# Patient Record
Sex: Male | Born: 2000 | Race: Black or African American | Hispanic: No | Marital: Single
Health system: Southern US, Community
[De-identification: ages and names within clinical notes are randomized; demographics above are authoritative.]

## PROBLEM LIST (undated history)

## (undated) DIAGNOSIS — F4310 Post-traumatic stress disorder, unspecified: Secondary | ICD-10-CM

## (undated) DIAGNOSIS — F909 Attention-deficit hyperactivity disorder, unspecified type: Secondary | ICD-10-CM

## (undated) DIAGNOSIS — R4689 Other symptoms and signs involving appearance and behavior: Secondary | ICD-10-CM

## (undated) DIAGNOSIS — F913 Oppositional defiant disorder: Secondary | ICD-10-CM

---
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
## 2021-03-04 IMAGING — DX DG FOOT COMPLETE 3+V*R*
3 series · 3 of 3 positions shown · non-contrast
Comparison: None.

CLINICAL DATA: 18-year-old male with spider bite to the right foot

EXAM:
RIGHT FOOT COMPLETE - 3+ VIEW

[foot ap]
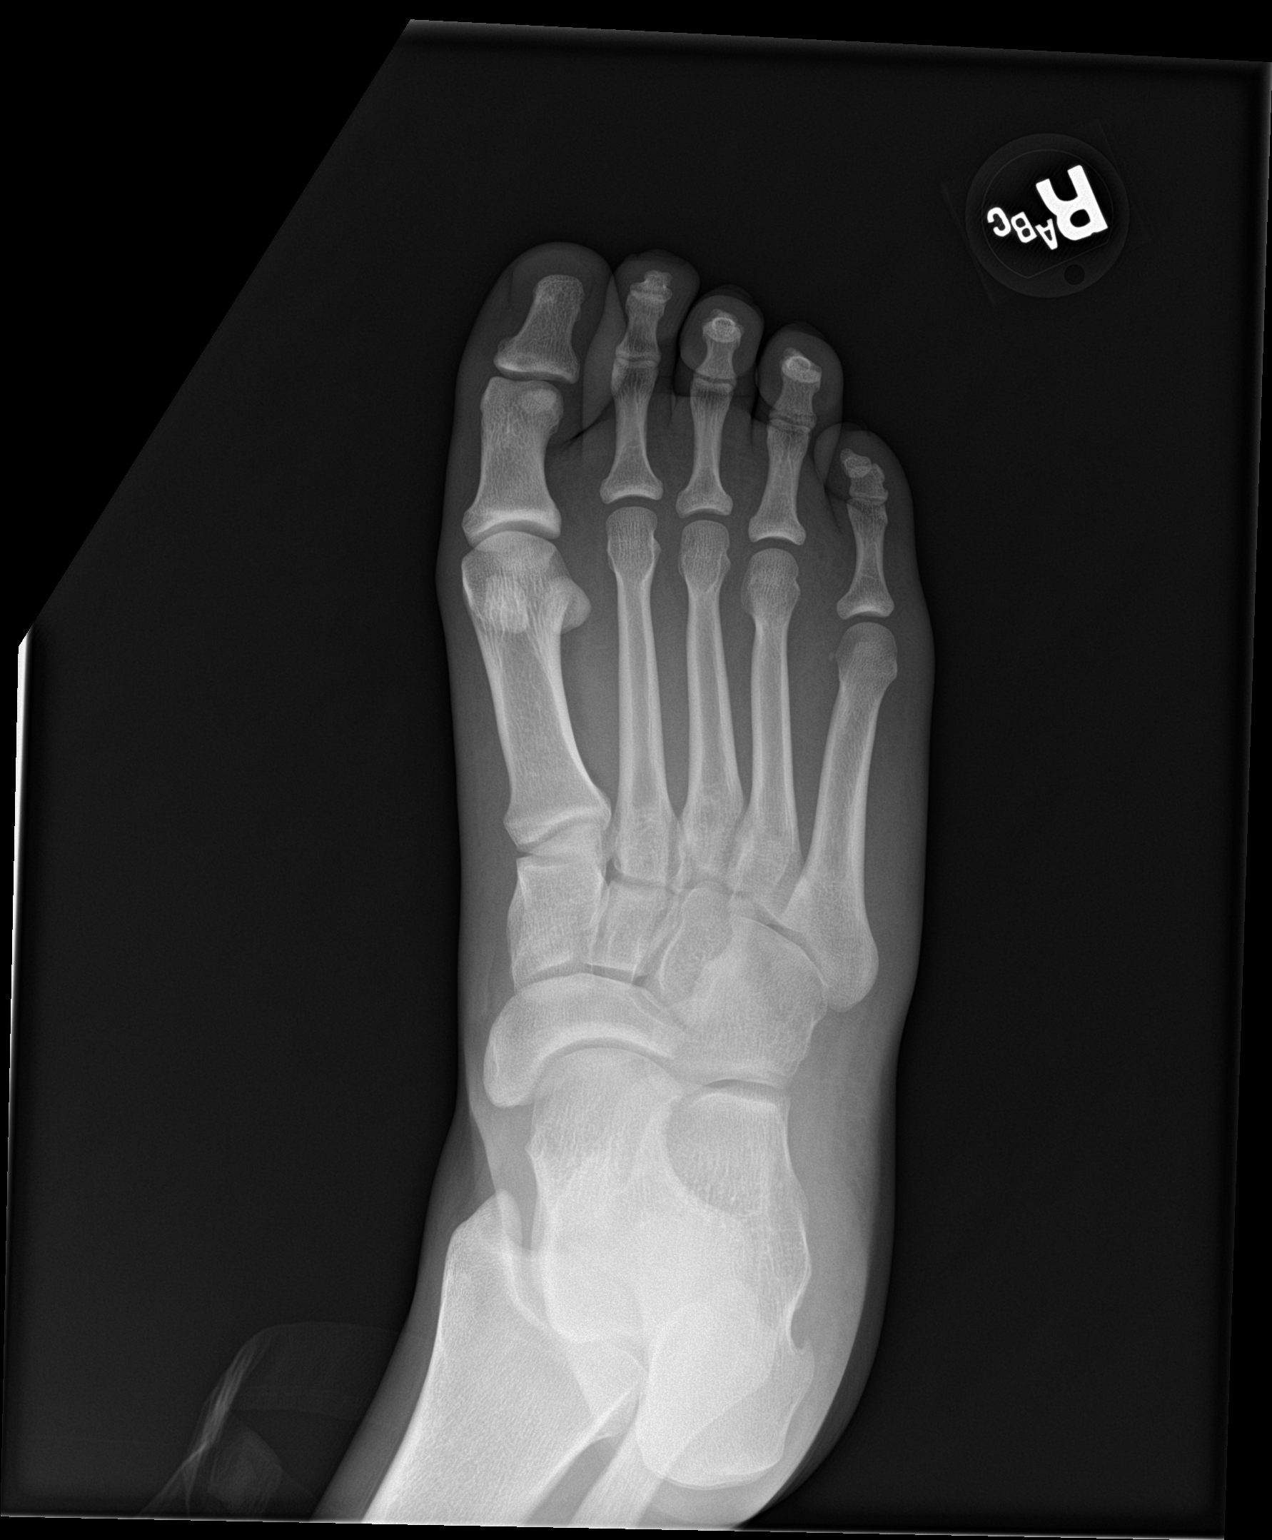

[foot obl]
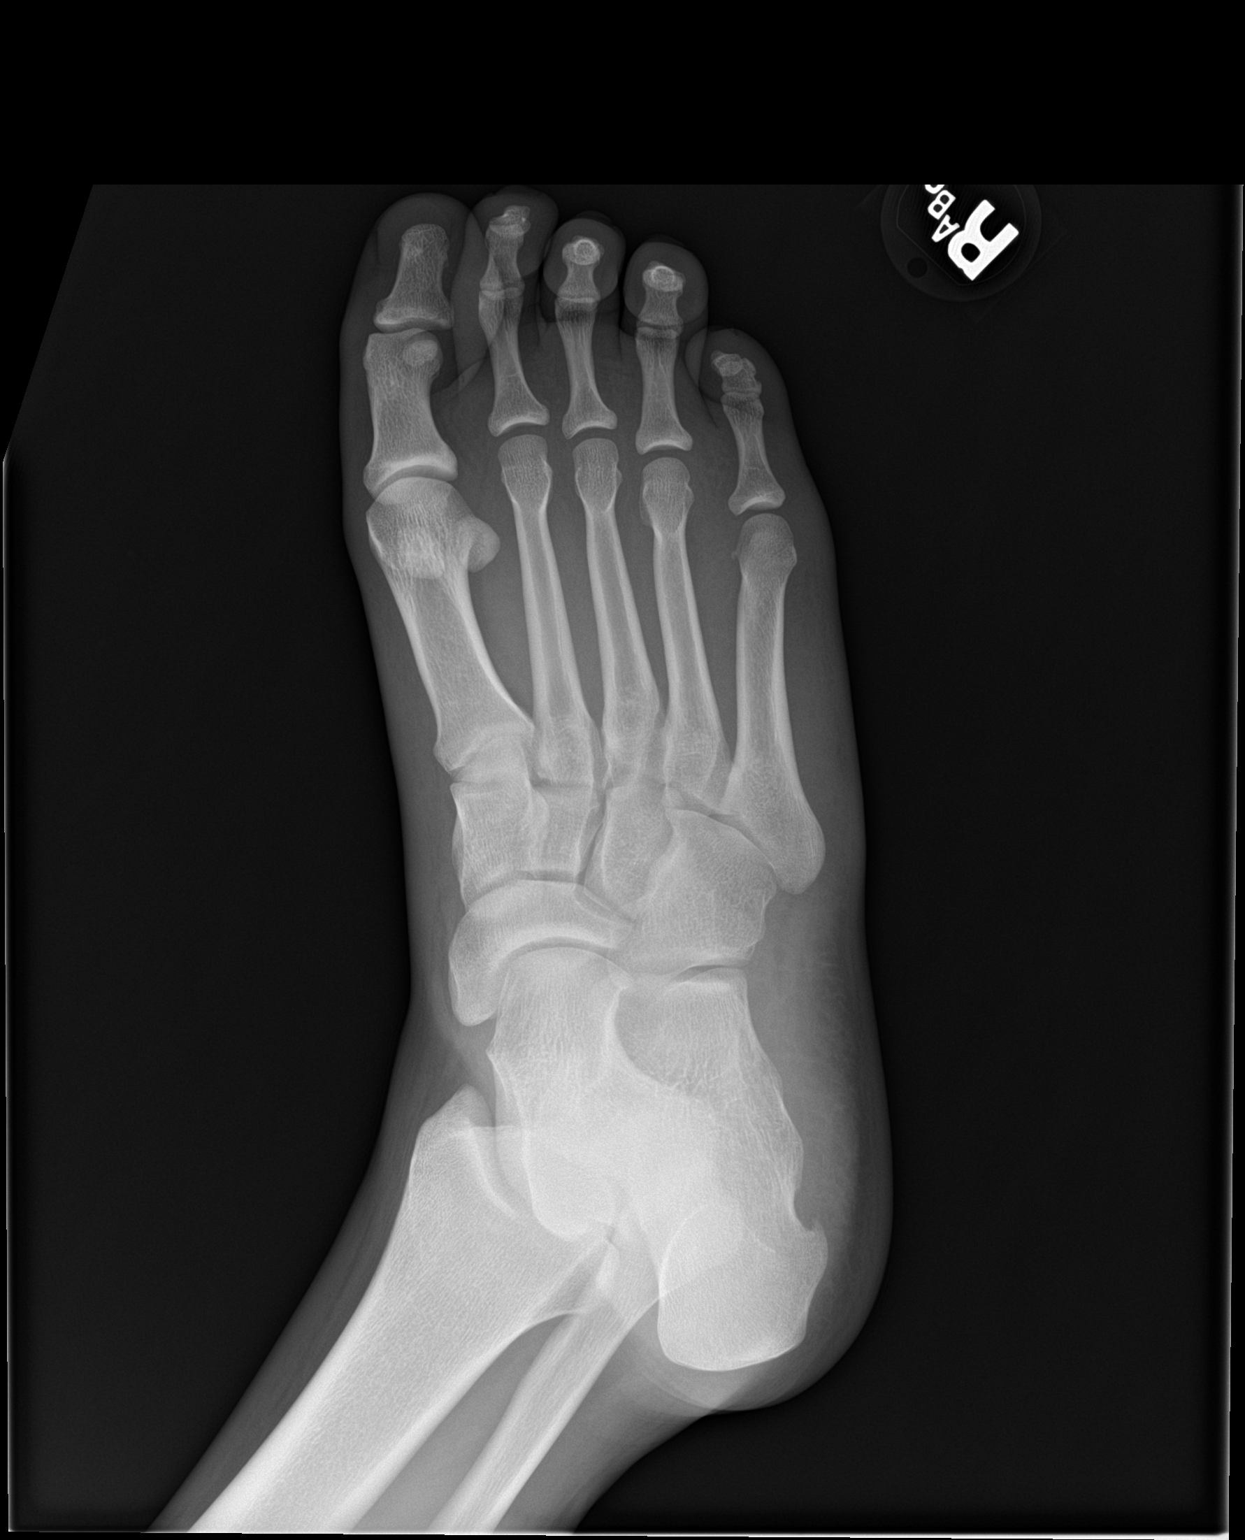

[foot lat]
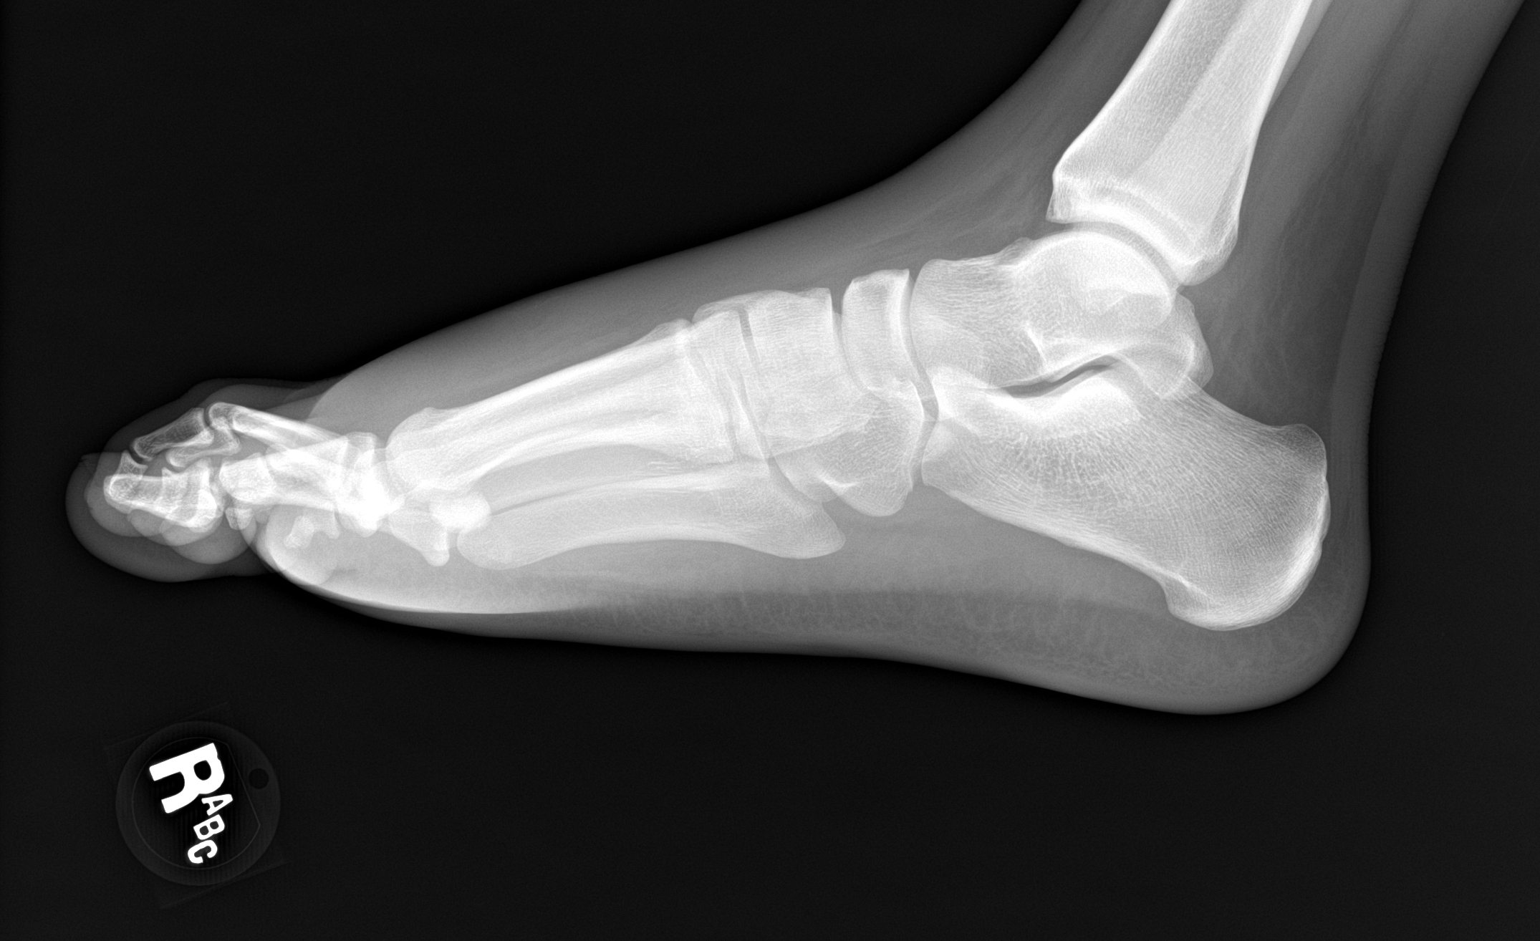

[3 of 3 positions shown; findings below may reference images not displayed]

FINDINGS: There is no acute fracture or dislocation. The bones are well
mineralized. No arthritic changes. There is diffuse soft tissue
swelling of the dorsum of the foot. No radiopaque foreign object or
soft tissue gas.
IMPRESSION: 1. No acute osseous pathology.
2. Soft tissue swelling of the dorsum of the foot.
